# Patient Record
Sex: Female | Born: 1985 | Race: Black or African American | Hispanic: No | Marital: Single | Smoking: Former smoker
Health system: Southern US, Community
[De-identification: ages and names within clinical notes are randomized; demographics above are authoritative.]

## PROBLEM LIST (undated history)

## (undated) DIAGNOSIS — M419 Scoliosis, unspecified: Secondary | ICD-10-CM

## (undated) DIAGNOSIS — J45909 Unspecified asthma, uncomplicated: Secondary | ICD-10-CM

---

## 2021-07-27 ENCOUNTER — Emergency Department
Admission: EM | Admit: 2021-07-27 | Discharge: 2021-07-27 | Disposition: A | Discharge status: Home / Self Care | Payer: Self-pay | Attending: Emergency Medicine | Admitting: Emergency Medicine

## 2021-07-27 ENCOUNTER — Emergency Department: Payer: Self-pay

## 2021-07-27 ENCOUNTER — Other Ambulatory Visit: Payer: Self-pay

## 2021-07-27 DIAGNOSIS — Z20822 Contact with and (suspected) exposure to covid-19: Secondary | ICD-10-CM | POA: Insufficient documentation

## 2021-07-27 DIAGNOSIS — J4521 Mild intermittent asthma with (acute) exacerbation: Secondary | ICD-10-CM | POA: Insufficient documentation

## 2021-07-27 HISTORY — DX: Unspecified asthma, uncomplicated: J45.909

## 2021-07-27 LAB — RESP PANEL BY RT-PCR (FLU A&B, COVID) ARPGX2
Influenza A by PCR: NEGATIVE
Influenza B by PCR: NEGATIVE
SARS Coronavirus 2 by RT PCR: NEGATIVE

## 2021-07-27 MED ORDER — IPRATROPIUM-ALBUTEROL 0.5-2.5 (3) MG/3ML IN SOLN
3.0000 mL | Freq: Once | RESPIRATORY_TRACT | Status: AC
  Administered 2021-07-27: 3 mL via RESPIRATORY_TRACT
  Filled 2021-07-27: qty 3

## 2021-07-27 MED ORDER — AZITHROMYCIN 500 MG PO TABS: 500.0000 mg | ORAL_TABLET | Freq: Every day | ORAL | 0 refills | Status: AC

## 2021-07-27 MED ORDER — PREDNISONE 20 MG PO TABS
60.0000 mg | ORAL_TABLET | Freq: Once | ORAL | Status: AC
  Administered 2021-07-27: 60 mg via ORAL
  Filled 2021-07-27: qty 3

## 2021-07-27 MED ORDER — BUDESONIDE-FORMOTEROL FUMARATE 80-4.5 MCG/ACT IN AERO: 2.0000 | INHALATION_SPRAY | Freq: Every day | RESPIRATORY_TRACT | 12 refills | Status: DC

## 2021-07-27 MED ORDER — PREDNISONE 50 MG PO TABS: 50.0000 mg | ORAL_TABLET | Freq: Every day | ORAL | 0 refills | Status: AC

## 2021-07-27 MED ORDER — ALBUTEROL SULFATE HFA 108 (90 BASE) MCG/ACT IN AERS: 2.0000 | INHALATION_SPRAY | Freq: Four times a day (QID) | RESPIRATORY_TRACT | 2 refills | Status: DC | PRN

## 2021-07-27 NOTE — ED Notes (Signed)
Patient taken to imaging. 

## 2021-07-27 NOTE — ED Triage Notes (Signed)
Pt states she has been wheezing for a few days, has been drinking coke and pepsi to try to help. Ran out of inhalers and does not have primary care. NAD noted. Ambulatory. Able to speak in complete sentences.  ?

## 2021-07-27 NOTE — Discharge Instructions (Addendum)
-  Please take your medications as prescribed. ?-Please establish with a primary care provider, as discussed. ?-Return to the emergency department at any time if you begin to experience any new or worsening symptoms ?

## 2021-07-27 NOTE — ED Notes (Signed)
Patient needs a new neb machine and prescription for nebs and inhalers. Patient states she is new in the area. Patient also feels she has an upper respiratory infection. ?

## 2021-07-27 NOTE — ED Provider Notes (Signed)
? ?[facility] ?Provider Note ? ? ? Event Date/Time  ? First MD Initiated Contact with Patient 07/27/21 1500   ?  (approximate) ? ? ?History  ? ?Chief Complaint ?Wheezing ? ? ?HPI ?Crystal Wood is a 36 y.o. female, history of asthma, presents to the emergency department for evaluation of shortness of breath.  Patient states that she has been wheezing for the past few days.  She typically takes albuterol and Symbicort at home whenever her asthma gets bad, however she has ran out for inhalers.  She has been drinking Coke and Pepsi to try to help with her wheezing.  She states that she has been intubated in the past before for her asthma.  Denies chest pain, back pain, abdominal pain, nausea/vomiting, fever/chills, cough, congestion, headache, urinary symptoms, leg swelling/pain, or rashes. ? ?History Limitations: No limitations. ? ?  ? ? ?Physical Exam  ?Triage Vital Signs: ?ED Triage Vitals  ?Enc Vitals Group  ?   BP 07/27/21 1426 (!) 141/102  ?   Pulse Rate 07/27/21 1426 91  ?   Resp 07/27/21 1426 20  ?   Temp 07/27/21 1426 98.5 ?F (36.9 ?C)  ?   Temp src --   ?   SpO2 07/27/21 1426 98 %  ?   Weight --   ?   Height 07/27/21 1427 4\' 10"  (1.473 m)  ?   Head Circumference --   ?   Peak Flow --   ?   Pain Score 07/27/21 1426 0  ?   Pain Loc --   ?   Pain Edu? --   ?   Excl. in [place]? --   ? ? ?Most recent vital signs: ?Vitals:  ? 07/27/21 1426 07/27/21 1508  ?BP: (!) 141/102 (!) 134/91  ?Pulse: 91   ?Resp: 20   ?Temp: 98.5 ?F (36.9 ?C)   ?SpO2: 98%   ? ? ?General: Awake, NAD.  Speaking in full sentences ?Skin: Warm, dry.  ?CV: Good peripheral perfusion.  ?Resp: Normal effort.  Wheezing appreciated bilaterally in the apices and bases.  No rhonchi or rales. ?Abd: Soft, non-tender. No distention.  ?Neuro: At baseline. No gross neurological deficits.  ?Other: No tonsillar swelling or exudates on throat exam.  Uvula midline.  No mandibular or cervical lymphadenopathy. ? ?Physical Exam ? ? ? ?ED Results  / Procedures / Treatments  ?Labs ?(all labs ordered are listed, but only abnormal results are displayed) ?Labs Reviewed  ?RESP PANEL BY RT-PCR (FLU A&B, COVID) ARPGX2  ? ? ? ?EKG ?Not applicable. ? ? ?RADIOLOGY ? ?ED Provider Interpretation: I personally viewed and interpreted this chest x-ray.  No evidence of pneumonia. ? ?DG Chest 2 View ? ?Result Date: 07/27/2021 ?CLINICAL DATA:  Wheezing, short of breath, asthma EXAM: CHEST - 2 VIEW COMPARISON:  None. FINDINGS: Frontal and lateral views of the chest demonstrate an unremarkable cardiac silhouette. Lungs are mildly hyperinflated, with bilateral perihilar interstitial prominence consistent with reactive airway disease. No airspace disease, effusion, or pneumothorax. No acute bony abnormalities. Marked right convex thoracic scoliosis. IMPRESSION: 1. Perihilar bronchovascular prominence and mild hyperinflation consistent with reactive airway disease. No lobar pneumonia. Electronically Signed   By: 07/29/2021 M.D.   On: 07/27/2021 15:32   ? ?PROCEDURES: ? ?Critical Care performed: None. ? ?Procedures ? ? ? ?MEDICATIONS ORDERED IN ED: ?Medications  ?ipratropium-albuterol (DUONEB) 0.5-2.5 (3) MG/3ML nebulizer solution 3 mL (3 mLs Nebulization Given 07/27/21 1641)  ?predniSONE (DELTASONE) tablet 60 mg (60 mg  Oral Given 07/27/21 1639)  ? ? ? ?IMPRESSION / MDM / ASSESSMENT AND PLAN / ED COURSE  ?I reviewed the triage vital signs and the nursing notes. ?             ?               ? ? ?Differential diagnosis includes, but is not limited to, influenza, COVID-19, asthma exacerbation, pneumonia, PE, bronchitis ? ?ED Course ?Appears well.  Vital signs within normal limits.  Given the patient's current wheezing, we will go ahead treat with DuoNeb and 60 mg prednisone. ? ?Chest x-ray shows evidence of hyperinflation, consistent with reactive airway disease.  Otherwise no evidence of pneumonia. ? ?Assessment/Plan ?Presentation consistent with mild asthma exacerbation.  Upon  reexamination following DuoNeb treatment and prednisone, the patient's lung exam has significantly improved.  Patient states that she is ready to go home.  I advised her that we are still waiting for the results of her influenza and COVID to determine the need for potential antiviral treatment, however she states that she does not believe that she has either of these and stated that she will follow-up with the results on her MyChart and seek out her regular provider for further management if needed.  I think that this is reasonable.  We will plan to discharge this patient with a prescription for prednisone.  Patient additionally requested azithromycin.  Advised her that this is not likely to benefit her given the lack of findings for pneumonia, however she states that this is what she is always given for asthma exacerbations.  We will provide her with a prescription to be used if her symptoms do not improve within 48 hours. ? ?Considered admission for this patient, but given the patient's stable vital signs, unremarkable work-up, and improvement with initial treatment, she is unlikely to benefit. ? ?Patient was provided with anticipatory guidance, return precautions, and educational material. Encouraged the patient to return to the emergency department at any time if they begin to experience any new or worsening symptoms.  ? ?  ? ? ?FINAL CLINICAL IMPRESSION(S) / ED DIAGNOSES  ? ?Final diagnoses:  ?Mild intermittent asthma with exacerbation  ? ? ? ?Rx / DC Orders  ? ?ED Discharge Orders   ? ?      Ordered  ?  budesonide-formoterol (SYMBICORT) 80-4.5 MCG/ACT inhaler  Daily       ? 07/27/21 1715  ?  albuterol (VENTOLIN HFA) 108 (90 Base) MCG/ACT inhaler  Every 6 hours PRN       ? 07/27/21 1715  ?  predniSONE (DELTASONE) 50 MG tablet  Daily with breakfast       ? 07/27/21 1715  ?  azithromycin (ZITHROMAX) 500 MG tablet  Daily       ? 07/27/21 1715  ? ?  ?  ? ?  ? ? ? ?Note:  This document was prepared using Dragon  voice recognition software and may include unintentional dictation errors. ?  ?Varney Daily, [place] ?07/27/21 1722 ? ?  ?Merwyn Katos, MD ?07/28/21 1536 ? ?

## 2021-10-18 ENCOUNTER — Other Ambulatory Visit: Payer: Self-pay

## 2021-10-18 ENCOUNTER — Emergency Department
Admission: EM | Admit: 2021-10-18 | Discharge: 2021-10-18 | Discharge status: Against Medical Advice | Payer: Self-pay | Attending: Emergency Medicine | Admitting: Emergency Medicine

## 2021-10-18 ENCOUNTER — Encounter: Payer: Self-pay | Admitting: Intensive Care

## 2021-10-18 DIAGNOSIS — Z5321 Procedure and treatment not carried out due to patient leaving prior to being seen by health care provider: Secondary | ICD-10-CM | POA: Insufficient documentation

## 2021-10-18 DIAGNOSIS — I10 Essential (primary) hypertension: Secondary | ICD-10-CM | POA: Insufficient documentation

## 2021-10-18 HISTORY — DX: Scoliosis, unspecified: M41.9

## 2021-10-18 NOTE — ED Triage Notes (Signed)
Patient c/o hypertension with headache for a few days. Denies hx high blood pressure.

## 2021-10-18 NOTE — ED Notes (Signed)
ED staff rounded on pt several times. Pt was not in room when ED staff rounded on pt.

## 2022-01-25 ENCOUNTER — Encounter: Payer: Self-pay | Admitting: Emergency Medicine

## 2022-01-25 ENCOUNTER — Emergency Department
Admission: EM | Admit: 2022-01-25 | Discharge: 2022-01-25 | Disposition: A | Discharge status: Home / Self Care | Payer: Self-pay | Attending: Emergency Medicine | Admitting: Emergency Medicine

## 2022-01-25 DIAGNOSIS — I10 Essential (primary) hypertension: Secondary | ICD-10-CM | POA: Insufficient documentation

## 2022-01-25 DIAGNOSIS — J45901 Unspecified asthma with (acute) exacerbation: Secondary | ICD-10-CM | POA: Insufficient documentation

## 2022-01-25 MED ORDER — PREDNISONE 20 MG PO TABS
60.0000 mg | ORAL_TABLET | ORAL | Status: AC
  Administered 2022-01-25: 60 mg via ORAL
  Filled 2022-01-25: qty 3

## 2022-01-25 MED ORDER — PREDNISONE 10 MG PO TABS: ORAL_TABLET | ORAL | 0 refills | Status: AC

## 2022-01-25 MED ORDER — IPRATROPIUM-ALBUTEROL 0.5-2.5 (3) MG/3ML IN SOLN
3.0000 mL | Freq: Once | RESPIRATORY_TRACT | Status: AC
  Administered 2022-01-25: 3 mL via RESPIRATORY_TRACT
  Filled 2022-01-25: qty 3

## 2022-01-25 MED ORDER — ALBUTEROL SULFATE HFA 108 (90 BASE) MCG/ACT IN AERS: 2.0000 | INHALATION_SPRAY | Freq: Four times a day (QID) | RESPIRATORY_TRACT | 2 refills | Status: AC | PRN

## 2022-01-25 MED ORDER — BUDESONIDE-FORMOTEROL FUMARATE 80-4.5 MCG/ACT IN AERO: 2.0000 | INHALATION_SPRAY | Freq: Every day | RESPIRATORY_TRACT | 12 refills | Status: AC

## 2022-01-25 NOTE — ED Notes (Signed)
Pt provided with po fluids, peanut butter, crackers for steroid administration,.

## 2022-01-25 NOTE — Discharge Instructions (Addendum)
Please take the prescribed medications and any medications that you have at home.  Follow up with your doctor as recommended.  If you develop any new or worsening symptoms, including but not limited to fever, persistent vomiting, worsening shortness of breath, or other symptoms that concern you, please return to the Emergency Department immediately.

## 2022-01-25 NOTE — ED Provider Notes (Signed)
[facility] Provider Note    Event Date/Time   First MD Initiated Contact with Patient 01/25/22 0505     (approximate)   History   Asthma   HPI  Crystal Wood is a 36 y.o. female who has a history of asthma and presents for evaluation of worsening wheezing.  She said that she ran out of her albuterol and her Symbicort about a week ago.  She has no local doctor.  She has had increased work of breathing particularly with exertion.  No recent fever, no significant cough.  No chest pain, no abdominal pain.     Physical Exam   Triage Vital Signs: ED Triage Vitals  Enc Vitals Group     BP 01/25/22 0455 (!) 156/104     Pulse Rate 01/25/22 0455 86     Resp 01/25/22 0455 20     Temp 01/25/22 0455 98.2 F (36.8 C)     Temp Source 01/25/22 0455 Oral     SpO2 01/25/22 0455 100 %     Weight 01/25/22 0455 61.7 kg (136 lb)     Height 01/25/22 0455 1.473 m (4\' 10" )     Head Circumference --      Peak Flow --      Pain Score 01/25/22 0452 0     Pain Loc --      Pain Edu? --      Excl. in [place]? --     Most recent vital signs: Vitals:   01/25/22 0455 01/25/22 0515  BP: (!) 156/104   Pulse: 86 77  Resp: 20 15  Temp: 98.2 F (36.8 C)   SpO2: 100% 97%     General: Awake, no distress.  CV:  Good peripheral perfusion.  normal heart sounds. Resp:  Normal effort.  No accessory muscle usage or intercostal retractions.  However she has very obvious expiratory wheezing throughout lung fields.  Able to speak in full sentences without difficulty. Abd:  No distention.    ED Results / Procedures / Treatments   Labs (all labs ordered are listed, but only abnormal results are displayed) Labs Reviewed - No data to display   PROCEDURES:  Critical Care performed: No  Procedures   MEDICATIONS ORDERED IN ED: Medications  predniSONE (DELTASONE) tablet 60 mg (60 mg Oral Given 01/25/22 0526)  ipratropium-albuterol (DUONEB) 0.5-2.5 (3) MG/3ML nebulizer  solution 3 mL (3 mLs Nebulization Given 01/25/22 0526)     IMPRESSION / MDM / ASSESSMENT AND PLAN / ED COURSE  I reviewed the triage vital signs and the nursing notes.                              Differential diagnosis includes, but is not limited to, asthma exacerbation, nonadherence to medication regimen, pneumonia, viral illness.  Patient's presentation is most consistent with acute presentation with potential threat to life or bodily function.  However her breathing is relatively controlled at this time.  She is hypertensive but this may be the result of her breathing difficulties.  No tachycardia, no hypoxemia.  Expiratory wheezing throughout lung fields.  She is very adamant that she does not want anything new or different.  I ordered a DuoNeb and prednisone 60 mg by mouth.  I suggested that we could use Decadron as an outpatient and as a one-time dose, but she interrupted me and says she does not want anything different, she will just wants a  refill on her medicine.  I refilled her Symbicort and her albuterol inhaler and wrote a prescription for a prednisone taper.  I provided her with information about how to set up an outpatient primary care provider.  I gave her my usual and customary return precautions.       FINAL CLINICAL IMPRESSION(S) / ED DIAGNOSES   Final diagnoses:  Mild asthma with exacerbation, unspecified whether persistent     Rx / DC Orders   ED Discharge Orders          Ordered    albuterol (VENTOLIN HFA) 108 (90 Base) MCG/ACT inhaler  Every 6 hours PRN        01/25/22 0525    budesonide-formoterol (SYMBICORT) 80-4.5 MCG/ACT inhaler  Daily        01/25/22 0525    predniSONE (DELTASONE) 10 MG tablet        01/25/22 0525             Note:  This document was prepared using Dragon voice recognition software and may include unintentional dictation errors.   Hinda Kehr, MD 01/25/22 [phone]

## 2022-01-25 NOTE — ED Triage Notes (Addendum)
Pt presents via POV with complaints of asthma attack that started two days ago when she "ran out of her medication". Respirations are equal and unlabored in triage. Pt requesting a refill on medications. Denies CP.

## 2023-09-10 IMAGING — CR DG CHEST 2V
2 series · 2 of 2 positions shown · non-contrast
Comparison: None.

CLINICAL DATA: Wheezing, short of breath, asthma

EXAM:
CHEST - 2 VIEW

[chest pa]
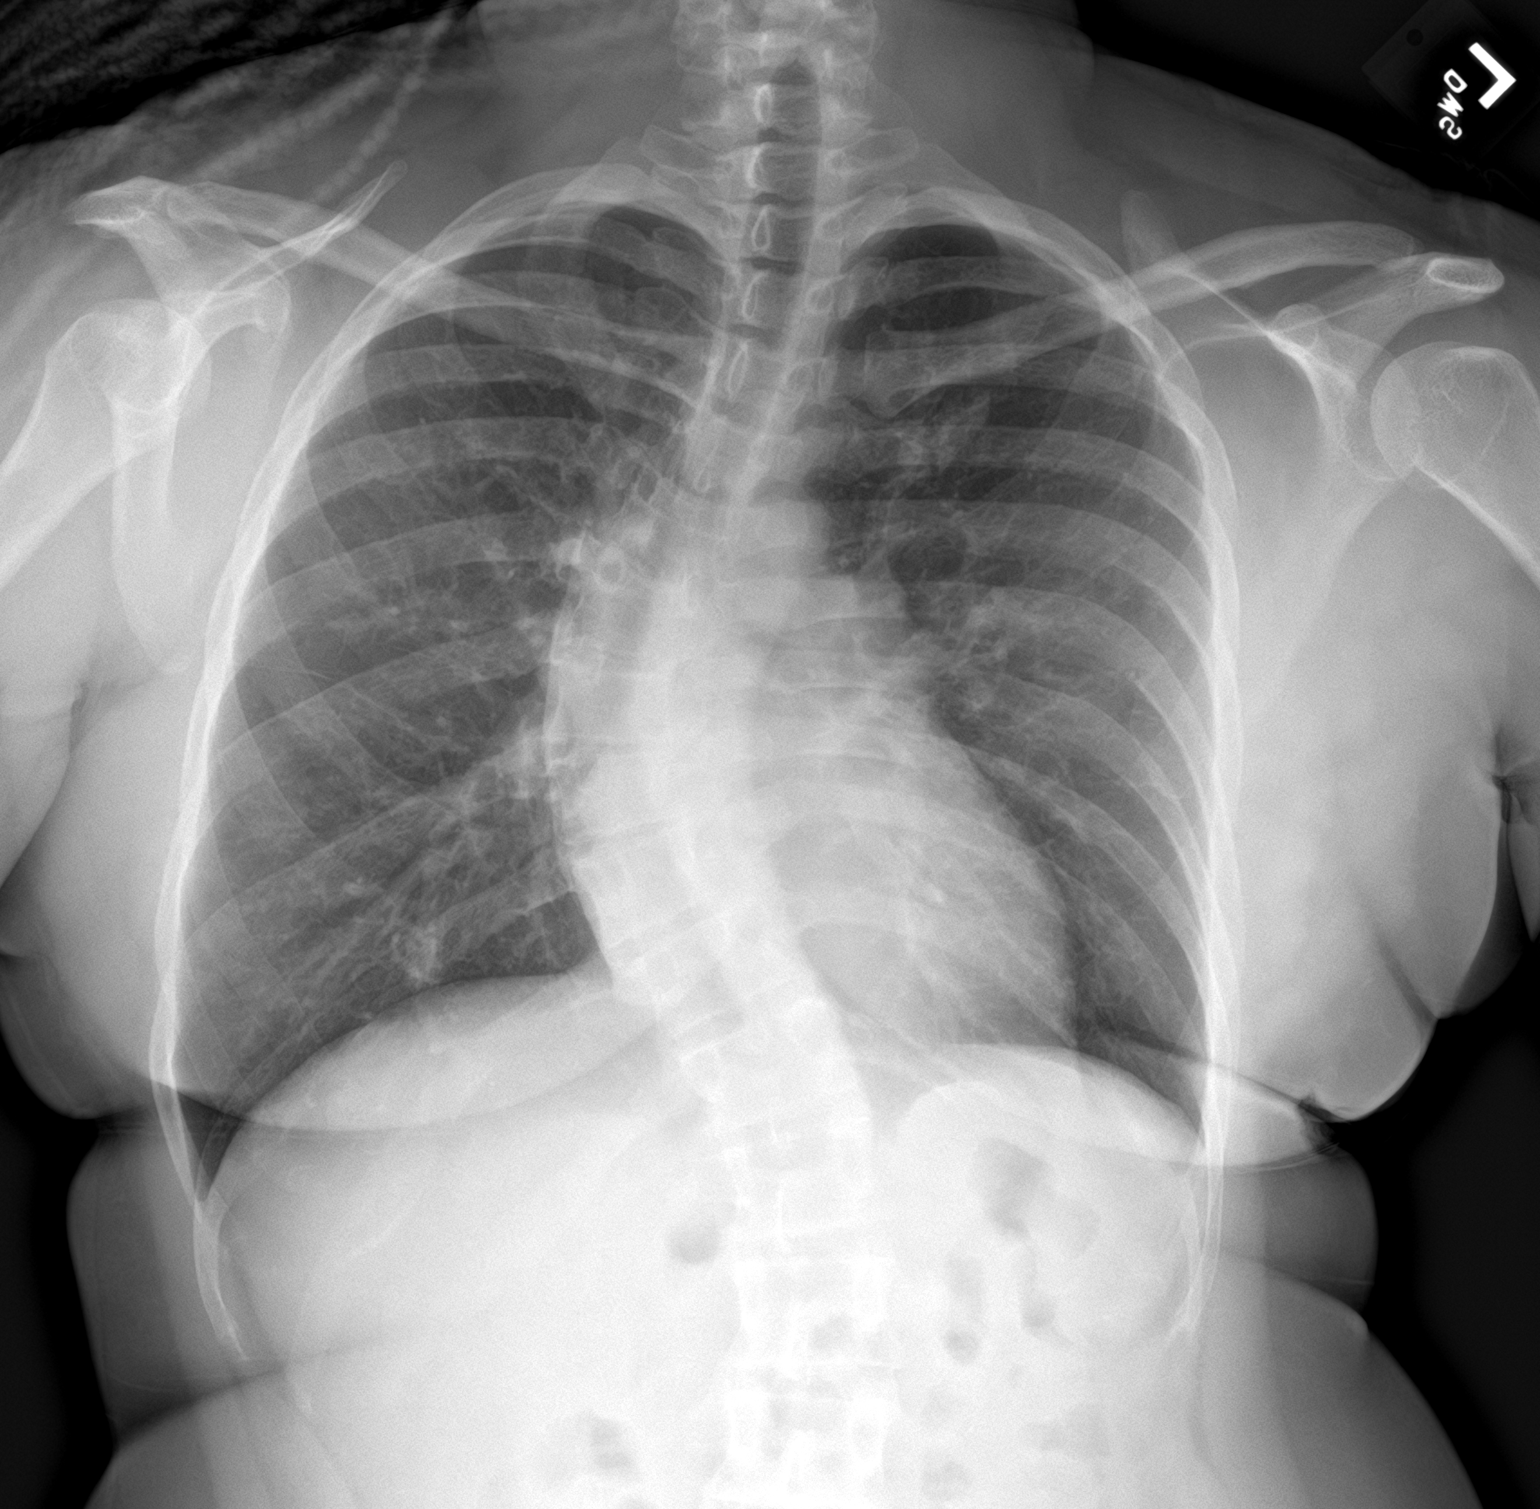

[chest lat]
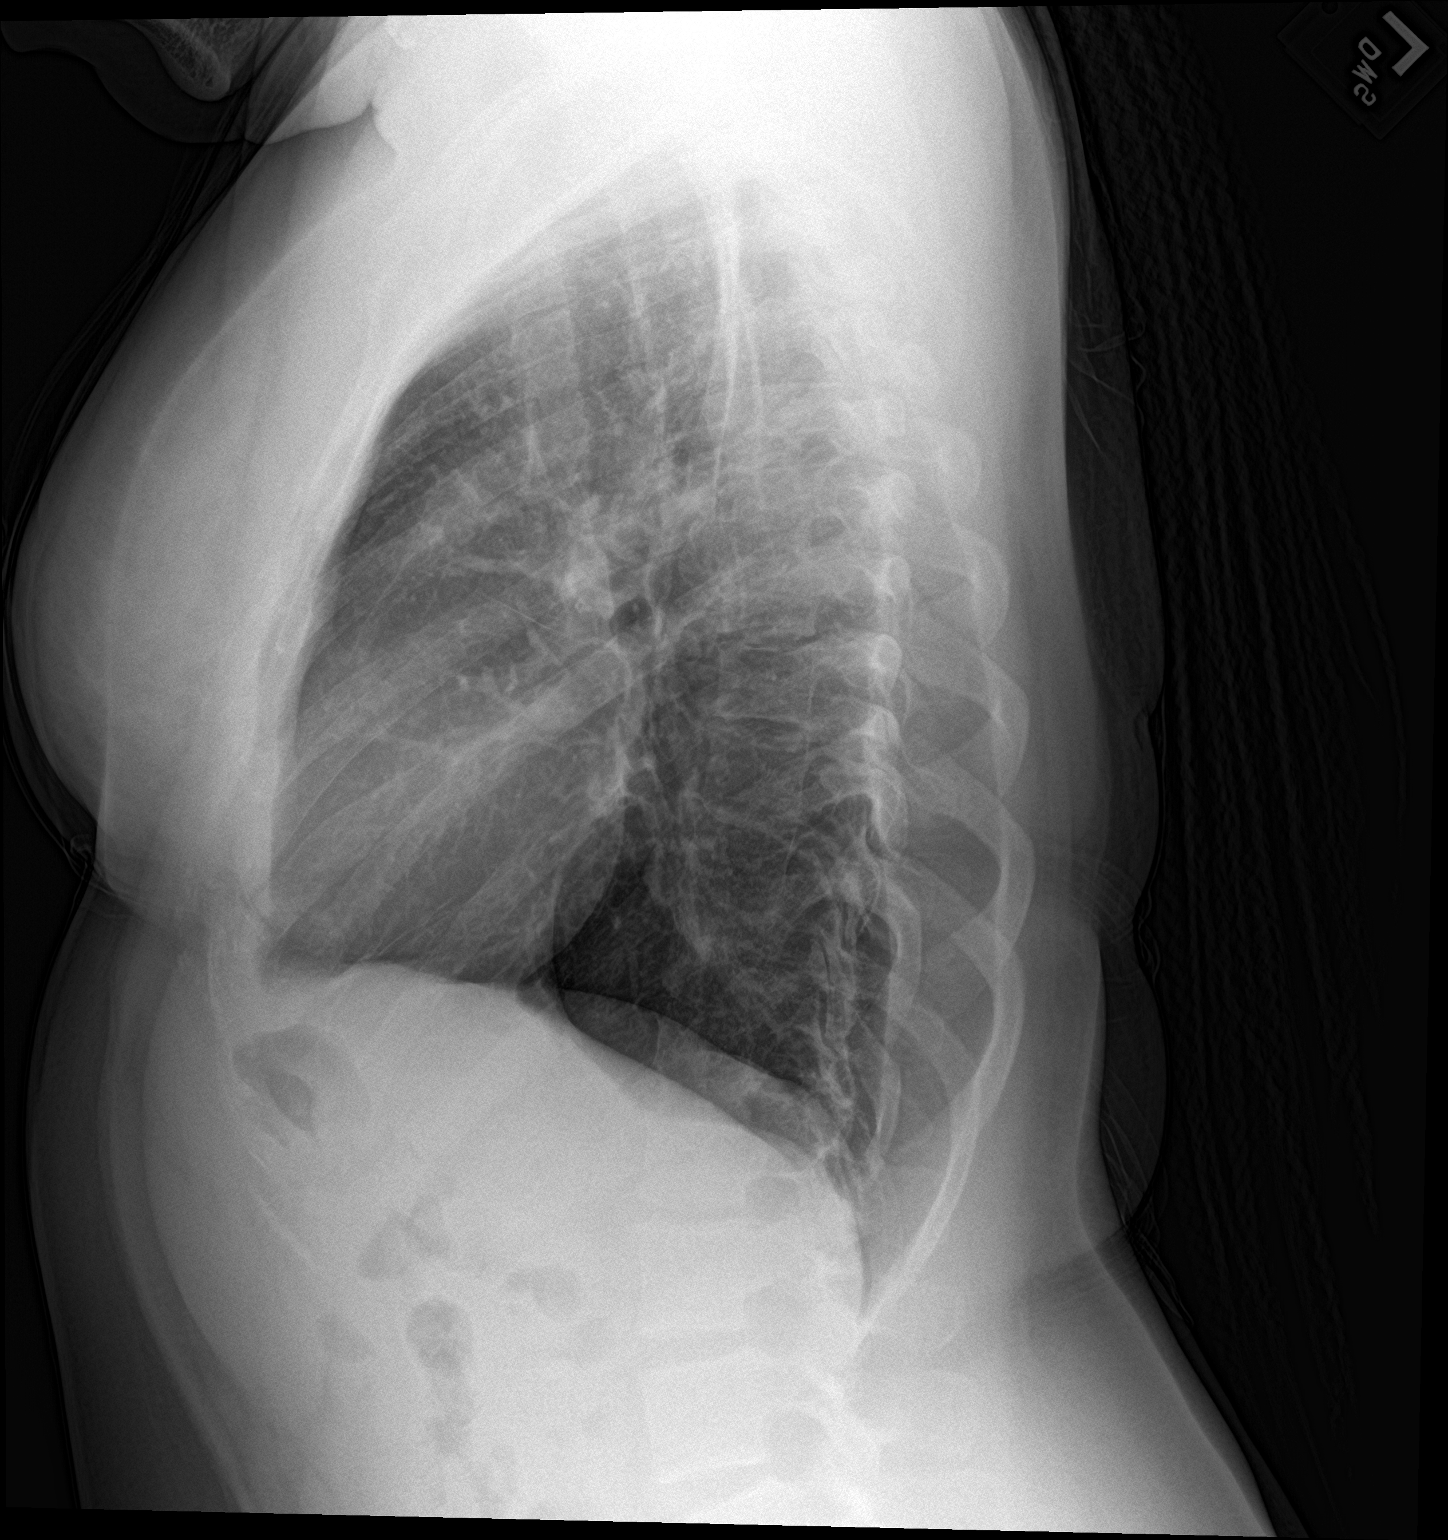

[2 of 2 positions shown; findings below may reference images not displayed]

FINDINGS: Frontal and lateral views of the chest demonstrate an unremarkable
cardiac silhouette. Lungs are mildly hyperinflated, with bilateral
perihilar interstitial prominence consistent with reactive airway
disease. No airspace disease, effusion, or pneumothorax. No acute
bony abnormalities. Marked right convex thoracic scoliosis.
IMPRESSION: 1. Perihilar bronchovascular prominence and mild hyperinflation
consistent with reactive airway disease. No lobar pneumonia.
